# Patient Record
Sex: Female | Born: 1970 | Race: White | Hispanic: No | Marital: Married | Smoking: Never smoker
Health system: Southern US, Community
[De-identification: ages and names within clinical notes are randomized; demographics above are authoritative.]

## PROBLEM LIST (undated history)

## (undated) HISTORY — PX: ABDOMINAL HYSTERECTOMY: SHX81

---

## 2005-10-22 ENCOUNTER — Ambulatory Visit: Payer: Self-pay | Admitting: Obstetrics and Gynecology

## 2006-07-08 ENCOUNTER — Encounter: Payer: Self-pay | Admitting: Maternal & Fetal Medicine

## 2007-01-10 ENCOUNTER — Ambulatory Visit: Payer: Self-pay | Admitting: Obstetrics and Gynecology

## 2007-01-11 ENCOUNTER — Inpatient Hospital Stay: Payer: Self-pay | Admitting: Obstetrics and Gynecology

## 2010-10-24 ENCOUNTER — Ambulatory Visit: Payer: Self-pay | Admitting: Obstetrics and Gynecology

## 2012-03-12 ENCOUNTER — Ambulatory Visit: Payer: Self-pay | Admitting: Obstetrics and Gynecology

## 2012-04-10 ENCOUNTER — Ambulatory Visit: Payer: Self-pay | Admitting: Obstetrics and Gynecology

## 2012-04-10 LAB — URINALYSIS, COMPLETE
Bacteria: NONE SEEN
Bilirubin,UR: NEGATIVE
Glucose,UR: NEGATIVE mg/dL (ref 0–75)
Ketone: NEGATIVE
RBC,UR: <1 /HPF (ref 0–5)
Specific Gravity: 1.003 (ref 1.003–1.030)
Squamous Epithelial: 3
WBC UR: NONE SEEN /HPF (ref 0–5)

## 2012-04-10 LAB — HEMOGLOBIN: HGB: 12.3 g/dL (ref 12.0–16.0)

## 2012-04-18 ENCOUNTER — Ambulatory Visit: Payer: Self-pay | Admitting: Obstetrics and Gynecology

## 2012-04-19 LAB — HEMATOCRIT: HCT: 28.1 % — ABNORMAL LOW (ref 35.0–47.0)

## 2014-02-16 ENCOUNTER — Ambulatory Visit: Payer: Self-pay | Admitting: Obstetrics and Gynecology

## 2014-09-14 NOTE — Op Note (Signed)
PATIENT NAME:  Terri Bowman, Terri Bowman MR#:  [id number] DATE OF BIRTH:  Feb 12, 1971  DATE OF PROCEDURE:  04/18/2012  PREOPERATIVE DIAGNOSIS: Dysfunctional uterine bleeding status post ablation.   POSTOPERATIVE DIAGNOSES: 1. Dysfunctional uterine bleeding status post ablation.  2. Extensive adhesive disease.   PROCEDURES:  1. Laparoscopic supracervical hysterectomy.  2. Extensive adhesiolysis.   SURGEON: Veatrice Bourbonicky Eaton Folmar, MD   ASSISTANT: Scrub tech    ANESTHESIA: General.   DRAINS: Foley.   ANTIBIOTICS: 1 gram IV.   FINDINGS: Uterus with adherent thick adhesions to the right anterior abdominal wall, omental adhesions throughout the lower abdomen, dense bladder flap adhesions and scarring.   ESTIMATED BLOOD LOSS: 50.   COMPLICATIONS: None.   SPECIMENS: Uterus morcellated.   PROCEDURE IN DETAIL: The patient was consented. Preoperative antibiotics were given. She was taken to the operating room and placed in the supine position, prepped and draped in the usual sterile fashion after being placed in dorsal lithotomy position using Allen stirrups. Cervix was visualized, grasped with a single-tooth tenaculum. I attempted to place sound through the cervix but was unable to due to ablation of cabal from previous procedure. Umbilicus was visualized. The patient had a very poorly defined flaccid umbilicus, somewhat gaping. I elected to proceed with transverse. Port placed, an 11 port was placed, pneumoperitoneum was allowed to be established with findings as noted above. I carefully then placed 11 ports in bilateral lower quadrants and then over approximately an hour alternating dissection from left and right ports and moving camera from the umbilicus to the left lower quadrant port. Was able to reduce the thick uterine adhesion there, omental adhesions, and confidently dissect the bladder away from the lower uterus.   Then using Enseal endoscopic tissue was cauterized and divided again with the round  ligaments bilaterally and then dissected out the uretero ovarian tubal complex, dissected free from the uterus. Parametrium was developed with the broad ligament. The Enseal device was then used to cauterize and divide the uterine arteries bilaterally. At this point we switched to Harmonic scalpel and was able to create the cervical stump. Again, unable to identify the cervical canal from the abdominal approach. Stump was cauterized extensively. There was persistent oozing from the area of the left uterine artery which was controlled with Harmonic scalpel and light Kleppinger. The ureters were visualized and appeared to be well away from any energy source. Pressure was lowered. The pelvis was copiously irrigated. There was persistent oozing just adjacent to the anterior cervix which was controlled with Kleppingers and  harmonic scalpel. Areas were observed for several minutes under low pressure and procedure was felt to be achieve maximum efficacy. Gross fluid was evacuated. Ports were removed. Incisions were closed with deep of 0, sub-Q with 3-0 Vicryl.   It was noted the umbilical incision extended somewhat to the left where the port was placed. This was superficial dermal insult which extended approximately 4 mm. The patient tolerated the procedure well, returned to the supine position, and left to the care of anesthesia. I anticipate a routine postoperative course.  ____________________________ Reatha Harpsicky L. Logan BoresEvans, MD rle:drc D: 04/18/2012 10:26:36 ET T: 04/18/2012 10:56:01 ET JOB#: [id number]  cc: Ricky L. Logan BoresEvans, MD, <Dictator> Augustina MoodICK L Liliann File MD ELECTRONICALLY SIGNED 04/19/2012 10:24

## 2016-08-03 ENCOUNTER — Encounter: Payer: Self-pay | Admitting: Medical

## 2016-08-03 ENCOUNTER — Ambulatory Visit: Payer: Self-pay | Admitting: Medical

## 2016-08-03 VITALS — BP 120/78 | HR 69 | Temp 98.7°F | Resp 16 | Ht 60.0 in | Wt 125.0 lb

## 2016-08-03 DIAGNOSIS — L089 Local infection of the skin and subcutaneous tissue, unspecified: Secondary | ICD-10-CM

## 2016-08-03 MED ORDER — CEPHALEXIN 500 MG PO CAPS: 500.0000 mg | ORAL_CAPSULE | Freq: Four times a day (QID) | ORAL | 0 refills | Status: DC

## 2016-08-03 NOTE — Progress Notes (Signed)
   Subjective:    Patient ID: Terri BoxLesley M Stang, female    DOB: 04/08/1971, 46 y.o.   MRN: [id number]  HPI 46 yo female who  Noticed right pinkie finger with swelling and pain yesterday morning. Does recall hitting it or being bitten by something. 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 Has taken nothing for pain.    Review of Systems  Constitutional: Negative for chills and fever.  Respiratory: Negative for shortness of breath.   Cardiovascular: Negative for chest pain.  Neurological: Negative for weakness and numbness.   or tingling.     Objective:   Physical Exam  Constitutional: She is oriented to person, place, and time. She appears well-developed.  HENT:  Head: Normocephalic and atraumatic.  Pulmonary/Chest: Effort normal.  Musculoskeletal: Normal range of motion. She exhibits edema and tenderness. She exhibits no deformity.  Neurological: She is alert and oriented to person, place, and time.  5th right finger with swelling, tenderness to palpation.  Erythema noted distally. Patient with limited range of motion due to swelling. <2sCR patient with acrylic nails, skin intact. Rest of right hand and left hand wnl.        Assessment & Plan:  Ice and elevate the finger. Take keflex as prescribed. Return Monday for recheck. otc motrin take as directed for pain and swelling. Seek out medical care over the weekend if worsening , increased pain or fever. RN Jcummings placed splint on patients finger.

## 2016-08-03 NOTE — Patient Instructions (Signed)
Wear splint , remove for showering. Take antibiotics as prescribed. Ice and elevate. otc motrin  600mg  (3 tablets) every 6 hours with food for pain and swelling. Return Monday for recheck. Seek medical care if worse, increased pain or fever.

## 2016-08-06 ENCOUNTER — Ambulatory Visit: Payer: Self-pay | Admitting: Medical

## 2016-08-06 VITALS — BP 118/78 | HR 64 | Temp 97.7°F | Resp 16 | Ht 60.0 in | Wt 130.0 lb

## 2016-08-06 DIAGNOSIS — L089 Local infection of the skin and subcutaneous tissue, unspecified: Secondary | ICD-10-CM

## 2016-08-06 NOTE — Progress Notes (Signed)
   Subjective:    Patient ID: Terri Bowman, female    DOB: Jul 15, 1970, 46 y.o.   MRN: [id number]  HPI 46yo female returns to clinic for recheck on skin infection located on right pinkie finger. Started on Keflex on 08/03/16(inital exam). Patient states it is "much better".    Review of Systems  No fever or chills, able to move finger better. Decreased redness of finger.     Objective:   Physical Exam  Constitutional: She appears well-developed and well-nourished.  HENT:  Head: Normocephalic and atraumatic.  Musculoskeletal: Normal range of motion.  of 5th right finger, erythema much improved, swelling still present but improved.        Assessment & Plan:  Skin infection of right 5th finger improving. Continue and finish Keflex antibiotics. Try to elevate as much as possible to improve swelling. Return to the clinic as needed.

## 2016-12-31 ENCOUNTER — Other Ambulatory Visit: Payer: Self-pay | Admitting: Obstetrics & Gynecology

## 2016-12-31 DIAGNOSIS — Z1231 Encounter for screening mammogram for malignant neoplasm of breast: Secondary | ICD-10-CM

## 2017-01-16 ENCOUNTER — Ambulatory Visit
Admission: RE | Admit: 2017-01-16 | Discharge: 2017-01-16 | Disposition: A | Discharge status: Home / Self Care | Payer: BLUE CROSS/BLUE SHIELD | Source: Ambulatory Visit | Attending: Obstetrics & Gynecology | Admitting: Obstetrics & Gynecology

## 2017-01-16 DIAGNOSIS — Z1231 Encounter for screening mammogram for malignant neoplasm of breast: Secondary | ICD-10-CM | POA: Diagnosis not present

## 2018-02-03 ENCOUNTER — Ambulatory Visit: Payer: Self-pay | Admitting: Medical

## 2018-02-03 ENCOUNTER — Encounter: Payer: Self-pay | Admitting: Medical

## 2018-02-03 VITALS — BP 153/88 | HR 70 | Temp 98.0°F | Resp 16 | Wt 133.4 lb

## 2018-02-03 DIAGNOSIS — H6123 Impacted cerumen, bilateral: Secondary | ICD-10-CM

## 2018-02-03 DIAGNOSIS — R0982 Postnasal drip: Secondary | ICD-10-CM

## 2018-02-03 DIAGNOSIS — J069 Acute upper respiratory infection, unspecified: Secondary | ICD-10-CM

## 2018-02-03 MED ORDER — AZITHROMYCIN 250 MG PO TABS
ORAL_TABLET | ORAL | 0 refills | Status: DC
Start: 2018-02-03 — End: 2018-07-08

## 2018-02-03 NOTE — Progress Notes (Signed)
   Subjective:    Patient ID: Terri Bowman, female    DOB: 06/30/1970, 47 y.o.   MRN: [id number]  HPI 47 yo female in non acute distress.  Started with scratchy throat on Thursday and not feeling well. , Slept a lot and rested over weekend..  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  Thinks it might be a cold but not sure and would like to be checked. Nasal congestion, clear discharge, cough colored sputum yellow. No fever or chills, some  nausea this morning lasting minutes.  No chest pain, mild shortness of breath and chest tightness..Using OTC generic cough medication.   Non smoker. Blood pressure (!) 153/88, pulse 70, temperature 98 F (36.7 C), temperature source Oral, resp. rate 16, weight 133 lb 6.4 oz (60.5 kg), SpO2 99 %.  Review of Systems  Constitutional: Negative for chills and fever.  HENT: Positive for postnasal drip, rhinorrhea, sinus pressure (maxillary), sinus pain, sneezing, sore throat (scratchy), trouble swallowing and voice change. Negative for tinnitus.        Stoppped up ears bilateral  Eyes: Negative for discharge, itching and visual disturbance.  Respiratory: Positive for cough, chest tightness and shortness of breath.   Cardiovascular: Negative for chest pain, palpitations and leg swelling.  Gastrointestinal: Positive for nausea (this am lasted minutes). Negative for abdominal pain.  Endocrine: Negative for polydipsia, polyphagia and polyuria.  Genitourinary: Negative for dysuria.  Musculoskeletal: Negative for myalgias.  Skin: Negative for rash.  Allergic/Immunologic: Positive for environmental allergies. Negative for food allergies.  Neurological: Positive for headaches (little bit). Negative for dizziness, syncope and light-headedness.  Hematological: Negative for adenopathy.  Psychiatric/Behavioral: Negative for behavioral problems, confusion, self-injury and suicidal ideas.       Objective:   Physical Exam  Constitutional: She is oriented to person, place, and  time. She appears well-developed and well-nourished.  HENT:  Head: Normocephalic and atraumatic.  Right Ear: External ear normal.  Left Ear: External ear normal.  Eyes: Pupils are equal, round, and reactive to light. Conjunctivae and EOM are normal.  Neck: Normal range of motion. Neck supple.  Cardiovascular: Normal rate, regular rhythm and normal heart sounds.  Pulmonary/Chest: Effort normal and breath sounds normal.  Lymphadenopathy:    She has no cervical adenopathy.  Neurological: She is alert and oriented to person, place, and time.  Skin: Skin is warm and dry.  Psychiatric: She has a normal mood and affect. Her behavior is normal. Judgment and thought content normal.    Post nasl drip coughand clearing of thraot noted.      Assessment & Plan:  URI, PND Cerumen impaction Debrox otic drops as directed. Meds ordered this encounter  Medications  . azithromycin (ZITHROMAX) 250 MG tablet    Sig: Take two tablets today then one tablet days 2-4.    Dispense:  6 tablet    Refill:  0  Take OTC Zyrtec or Claritin and Mucinex take as directed. Stope cough medication it may be raising blood pressure . Return in  3-5 days if not improving. Patient verbalizes understanding and has no questions at discharge.

## 2018-02-03 NOTE — Patient Instructions (Signed)
Earwax Buildup, Adult The ears produce a substance called earwax that helps keep bacteria out of the ear and protects the skin in the ear canal. Occasionally, earwax can build up in the ear and cause discomfort or hearing loss. What increases the risk? This condition is more likely to develop in people who:  Are female.  Are elderly.  Naturally produce more earwax.  Clean their ears often with cotton swabs.  Use earplugs often.  Use in-ear headphones often.  Wear hearing aids.  Have narrow ear canals.  Have earwax that is overly thick or sticky.  Have eczema.  Are dehydrated.  Have excess hair in the ear canal.  What are the signs or symptoms? Symptoms of this condition include:  Reduced or muffled hearing.  A feeling of fullness in the ear or feeling that the ear is plugged.  Fluid coming from the ear.  Ear pain.  Ear itch.  Ringing in the ear.  Coughing.  An obvious piece of earwax that can be seen inside the ear canal.  How is this diagnosed? This condition may be diagnosed based on:  Your symptoms.  Your medical history.  An ear exam. During the exam, your health care provider will look into your ear with an instrument called an otoscope.  You may have tests, including a hearing test. How is this treated? This condition may be treated by:  Using ear drops to soften the earwax.  Having the earwax removed by a health care provider. The health care provider may: ? Flush the ear with water. ? Use an instrument that has a loop on the end (curette). ? Use a suction device.  Surgery to remove the wax buildup. This may be done in severe cases.  Follow these instructions at home:  Take over-the-counter and prescription medicines only as told by your health care provider.  Do not put any objects, including cotton swabs, into your ear. You can clean the opening of your ear canal with a washcloth or facial tissue.  Follow instructions from your health  care provider about cleaning your ears. Do not over-clean your ears.  Drink enough fluid to keep your urine clear or pale yellow. This will help to thin the earwax.  Keep all follow-up visits as told by your health care provider. If earwax builds up in your ears often or if you use hearing aids, consider seeing your health care provider for routine, preventive ear cleanings. Ask your health care provider how often you should schedule your cleanings.  If you have hearing aids, clean them according to instructions from the manufacturer and your health care provider. Contact a health care provider if:  You have ear pain.  You develop a fever.  You have blood, pus, or other fluid coming from your ear.  You have hearing loss.  You have ringing in your ears that does not go away.  Your symptoms do not improve with treatment.  You feel like the room is spinning (vertigo). Summary  Earwax can build up in the ear and cause discomfort or hearing loss.  The most common symptoms of this condition include reduced or muffled hearing and a feeling of fullness in the ear or feeling that the ear is plugged.  This condition may be diagnosed based on your symptoms, your medical history, and an ear exam.  This condition may be treated by using ear drops to soften the earwax or by having the earwax removed by a health care provider.  Do   not put any objects, including cotton swabs, into your ear. You can clean the opening of your ear canal with a washcloth or facial tissue. This information is not intended to replace advice given to you by your health care provider. Make sure you discuss any questions you have with your health care provider. Document Released: 06/21/2004 Document Revised: 07/25/2016 Document Reviewed: 07/25/2016 Elsevier Interactive Patient Education  2018 [employer] Upper Respiratory Infection, Adult Most upper respiratory infections (URIs) are caused by a virus. A URI affects  the nose, throat, and upper air passages. The most common type of URI is often called "the common cold." Follow these instructions at home:  Take medicines only as told by your doctor.  Gargle warm saltwater or take cough drops to comfort your throat as told by your doctor.  Use a warm mist humidifier or inhale steam from a shower to increase air moisture. This may make it easier to breathe.  Drink enough fluid to keep your pee (urine) clear or pale yellow.  Eat soups and other clear broths.  Have a healthy diet.  Rest as needed.  Go back to work when your fever is gone or your doctor says it is okay. ? You may need to stay home longer to avoid giving your URI to others. ? You can also wear a face mask and wash your hands often to prevent spread of the virus.  Use your inhaler more if you have asthma.  Do not use any tobacco products, including cigarettes, chewing tobacco, or electronic cigarettes. If you need help quitting, ask your doctor. Contact a doctor if:  You are getting worse, not better.  Your symptoms are not helped by medicine.  You have chills.  You are getting more short of breath.  You have brown or red mucus.  You have yellow or brown discharge from your nose.  You have pain in your face, especially when you bend forward.  You have a fever.  You have puffy (swollen) neck glands.  You have pain while swallowing.  You have white areas in the back of your throat. Get help right away if:  You have very bad or constant: ? Headache. ? Ear pain. ? Pain in your forehead, behind your eyes, and over your cheekbones (sinus pain). ? Chest pain.  You have long-lasting (chronic) lung disease and any of the following: ? Wheezing. ? Long-lasting cough. ? Coughing up blood. ? A change in your usual mucus.  You have a stiff neck.  You have changes in your: ? Vision. ? Hearing. ? Thinking. ? Mood. This information is not intended to replace advice  given to you by your health care provider. Make sure you discuss any questions you have with your health care provider. Document Released: 10/31/2007 Document Revised: 01/15/2016 Document Reviewed: 08/19/2013 Elsevier Interactive Patient Education  2018 [employer].

## 2018-02-18 ENCOUNTER — Other Ambulatory Visit: Payer: Self-pay | Admitting: Obstetrics & Gynecology

## 2018-02-18 DIAGNOSIS — Z1231 Encounter for screening mammogram for malignant neoplasm of breast: Secondary | ICD-10-CM

## 2018-03-05 ENCOUNTER — Ambulatory Visit
Admission: RE | Admit: 2018-03-05 | Discharge: 2018-03-05 | Disposition: A | Discharge status: Home / Self Care | Payer: BLUE CROSS/BLUE SHIELD | Source: Ambulatory Visit | Attending: Obstetrics & Gynecology | Admitting: Obstetrics & Gynecology

## 2018-03-05 DIAGNOSIS — Z1231 Encounter for screening mammogram for malignant neoplasm of breast: Secondary | ICD-10-CM

## 2018-07-08 ENCOUNTER — Ambulatory Visit: Payer: Self-pay | Admitting: Medical

## 2018-07-08 ENCOUNTER — Encounter: Payer: Self-pay | Admitting: Medical

## 2018-07-08 VITALS — BP 132/95 | HR 69 | Temp 98.5°F | Resp 16 | Wt 133.6 lb

## 2018-07-08 DIAGNOSIS — J029 Acute pharyngitis, unspecified: Secondary | ICD-10-CM

## 2018-07-08 MED ORDER — AMOXICILLIN-POT CLAVULANATE 875-125 MG PO TABS: 1.0000 | ORAL_TABLET | Freq: Two times a day (BID) | ORAL | 0 refills | Status: AC

## 2018-07-08 NOTE — Progress Notes (Signed)
   Subjective:    Patient ID: Terri Bowman, female    DOB: 31-Aug-1970, 48 y.o.   MRN: [id number]  HPI 48 yo female in non acute distress , stared with sore throat and congestion yesterday. Daughter  4 yo diagnosed with strep throat today.Has slept in bed with daughter the last  2 days. Denies fever or chills, cough or shortness of breath. A little nausea this morning.Denies chest pain or rash.   Blood pressure (!) 132/95, pulse 69, temperature 98.5 F (36.9 C), temperature source Tympanic, resp. rate 16, weight 133 lb 9.6 oz (60.6 kg), SpO2 100 %. No Known Allergies  Review of Systems  Constitutional: Negative for chills and fever.  HENT: Positive for postnasal drip.   Eyes: Negative for discharge and itching.  Respiratory: Positive for cough (mild). Negative for shortness of breath and wheezing.   Cardiovascular: Negative for chest pain.  Gastrointestinal: Positive for nausea (a little bit). Negative for diarrhea and vomiting.  Musculoskeletal: Positive for myalgias (little bit).  Skin: Negative for rash.      non smoker Objective:   Physical Exam Vitals signs and nursing note reviewed.  Constitutional:      Appearance: She is well-developed.  HENT:     Head: Normocephalic and atraumatic.     Jaw: There is normal jaw occlusion.     Right Ear: Ear canal normal. There is impacted cerumen.     Left Ear: Ear canal normal. There is impacted cerumen.     Nose: Congestion present.     Mouth/Throat:     Mouth: Mucous membranes are moist. No oral lesions.     Pharynx: Oropharynx is clear. Posterior oropharyngeal erythema present. No pharyngeal swelling, oropharyngeal exudate or uvula swelling.     Tonsils: No tonsillar exudate. Swelling: 2+ on the right. 2+ on the left.  Eyes:     Conjunctiva/sclera: Conjunctivae normal.     Pupils: Pupils are equal, round, and reactive to light.  Neck:     Musculoskeletal: Normal range of motion and neck supple.  Cardiovascular:     Rate and  Rhythm: Normal rate and regular rhythm.     Heart sounds: Normal heart sounds.  Pulmonary:     Effort: Pulmonary effort is normal.     Breath sounds: Normal breath sounds.  Lymphadenopathy:     Cervical: No cervical adenopathy.  Skin:    General: Skin is warm and dry.     Capillary Refill: Capillary refill takes less than 2 seconds.  Neurological:     General: No focal deficit present.     Mental Status: She is alert and oriented to person, place, and time.  Psychiatric:        Mood and Affect: Mood normal.        Behavior: Behavior normal.       No cough in room    Assessment & Plan:  Phayngitis Meds ordered this encounter  Medications  . amoxicillin-clavulanate (AUGMENTIN) 875-125 MG tablet    Sig: Take 1 tablet by mouth 2 (two) times daily.    Dispense:  20 tablet    Refill:  0  Dilute salt water gargles,  Soft foods , avoid acidic foods. Return in 3-5 days if not improving. Patient verbalizes understanding and has no questions at discharge.

## 2018-07-09 ENCOUNTER — Ambulatory Visit: Payer: Self-pay | Admitting: Medical

## 2019-04-28 ENCOUNTER — Other Ambulatory Visit: Payer: Self-pay

## 2019-04-28 DIAGNOSIS — Z20822 Contact with and (suspected) exposure to covid-19: Secondary | ICD-10-CM

## 2019-04-30 LAB — NOVEL CORONAVIRUS, NAA: SARS-CoV-2, NAA: NOT DETECTED

## 2019-05-13 ENCOUNTER — Other Ambulatory Visit: Payer: Self-pay | Admitting: Obstetrics & Gynecology

## 2019-05-13 DIAGNOSIS — Z1231 Encounter for screening mammogram for malignant neoplasm of breast: Secondary | ICD-10-CM

## 2019-06-05 ENCOUNTER — Ambulatory Visit
Admission: RE | Admit: 2019-06-05 | Discharge: 2019-06-05 | Disposition: A | Discharge status: Home / Self Care | Payer: BC Managed Care – PPO | Source: Ambulatory Visit | Attending: Obstetrics & Gynecology | Admitting: Obstetrics & Gynecology

## 2019-06-05 DIAGNOSIS — Z1231 Encounter for screening mammogram for malignant neoplasm of breast: Secondary | ICD-10-CM | POA: Insufficient documentation

## 2020-05-17 ENCOUNTER — Other Ambulatory Visit: Payer: Self-pay | Admitting: Obstetrics & Gynecology

## 2020-05-17 DIAGNOSIS — Z1231 Encounter for screening mammogram for malignant neoplasm of breast: Secondary | ICD-10-CM

## 2020-06-06 ENCOUNTER — Other Ambulatory Visit: Payer: Self-pay

## 2020-06-06 ENCOUNTER — Ambulatory Visit
Admission: RE | Admit: 2020-06-06 | Discharge: 2020-06-06 | Disposition: A | Discharge status: Home / Self Care | Payer: BC Managed Care – PPO | Source: Ambulatory Visit | Attending: Obstetrics & Gynecology | Admitting: Obstetrics & Gynecology

## 2020-06-06 DIAGNOSIS — Z1231 Encounter for screening mammogram for malignant neoplasm of breast: Secondary | ICD-10-CM | POA: Insufficient documentation

## 2021-08-08 ENCOUNTER — Other Ambulatory Visit: Payer: Self-pay | Admitting: Certified Nurse Midwife

## 2021-08-08 DIAGNOSIS — Z1231 Encounter for screening mammogram for malignant neoplasm of breast: Secondary | ICD-10-CM

## 2021-09-13 ENCOUNTER — Ambulatory Visit
Admission: RE | Admit: 2021-09-13 | Discharge: 2021-09-13 | Disposition: A | Discharge status: Home / Self Care | Payer: BC Managed Care – PPO | Source: Ambulatory Visit | Attending: Certified Nurse Midwife | Admitting: Certified Nurse Midwife

## 2021-09-13 DIAGNOSIS — Z1231 Encounter for screening mammogram for malignant neoplasm of breast: Secondary | ICD-10-CM | POA: Insufficient documentation

## 2022-09-19 ENCOUNTER — Other Ambulatory Visit: Payer: Self-pay | Admitting: Family Medicine

## 2022-09-19 DIAGNOSIS — Z1231 Encounter for screening mammogram for malignant neoplasm of breast: Secondary | ICD-10-CM

## 2022-09-21 ENCOUNTER — Ambulatory Visit
Admission: RE | Admit: 2022-09-21 | Discharge: 2022-09-21 | Disposition: A | Discharge status: Home / Self Care | Payer: BC Managed Care – PPO | Source: Ambulatory Visit | Attending: Family Medicine | Admitting: Family Medicine

## 2022-09-21 DIAGNOSIS — Z1231 Encounter for screening mammogram for malignant neoplasm of breast: Secondary | ICD-10-CM | POA: Insufficient documentation

## 2022-09-26 ENCOUNTER — Other Ambulatory Visit: Payer: Self-pay | Admitting: Family Medicine

## 2022-09-26 DIAGNOSIS — N63 Unspecified lump in unspecified breast: Secondary | ICD-10-CM

## 2022-09-26 DIAGNOSIS — R928 Other abnormal and inconclusive findings on diagnostic imaging of breast: Secondary | ICD-10-CM

## 2022-10-03 ENCOUNTER — Ambulatory Visit
Admission: RE | Admit: 2022-10-03 | Discharge: 2022-10-03 | Disposition: A | Discharge status: Home / Self Care | Payer: BC Managed Care – PPO | Source: Ambulatory Visit | Attending: Family Medicine | Admitting: Family Medicine

## 2022-10-03 DIAGNOSIS — N63 Unspecified lump in unspecified breast: Secondary | ICD-10-CM

## 2022-10-03 DIAGNOSIS — R928 Other abnormal and inconclusive findings on diagnostic imaging of breast: Secondary | ICD-10-CM | POA: Insufficient documentation

## 2022-11-16 IMAGING — MG MM DIGITAL SCREENING BILAT W/ TOMO AND CAD
8 series · 9 of 24 positions shown · non-contrast
Comparison: Previous exam(s).

CLINICAL DATA: Screening.

EXAM:
DIGITAL SCREENING BILATERAL MAMMOGRAM WITH TOMOSYNTHESIS AND CAD
TECHNIQUE: Bilateral screening digital craniocaudal and mediolateral oblique
mammograms were obtained. Bilateral screening digital breast
tomosynthesis was performed. The images were evaluated with
computer-aided detection.

[L CC synth-2D]
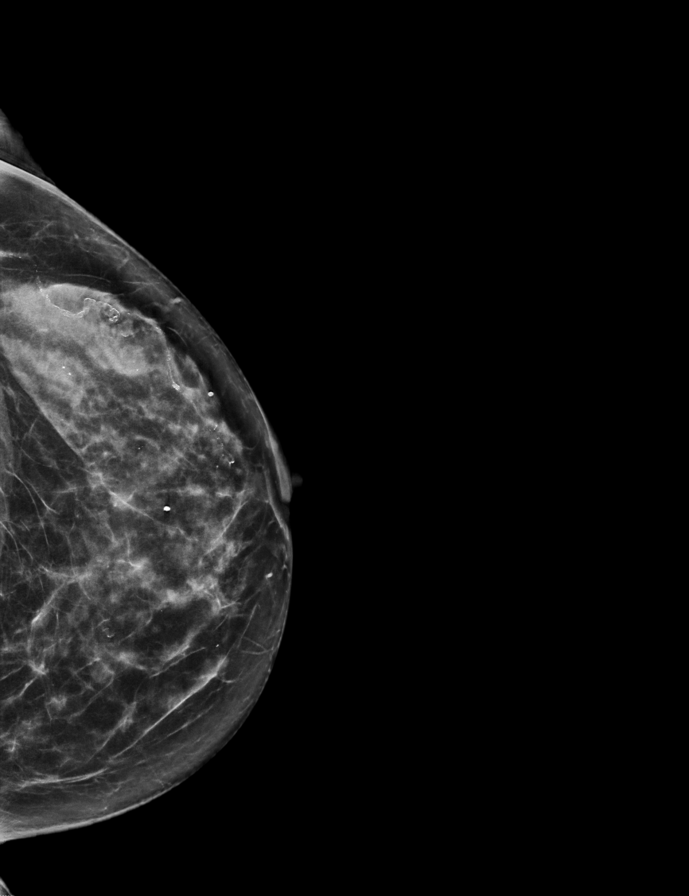

[R MLO synth-2D]
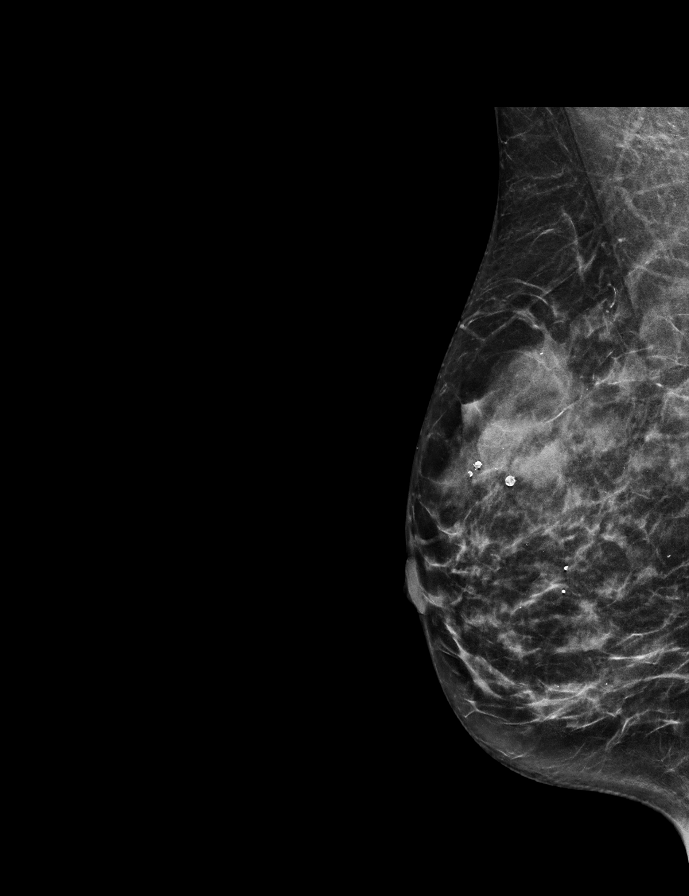

[L MLO synth-2D]
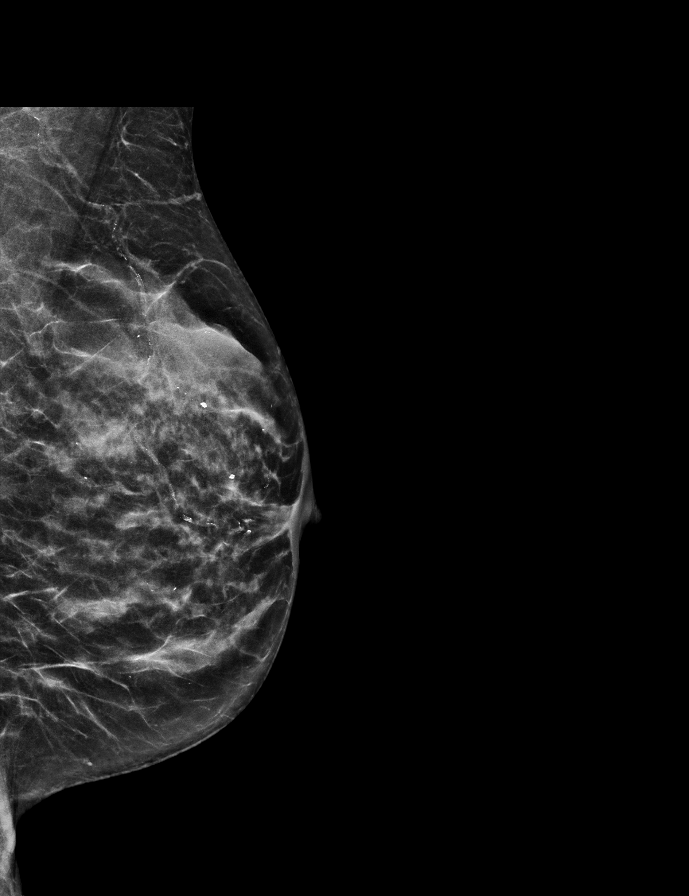

[R CC synth-2D]
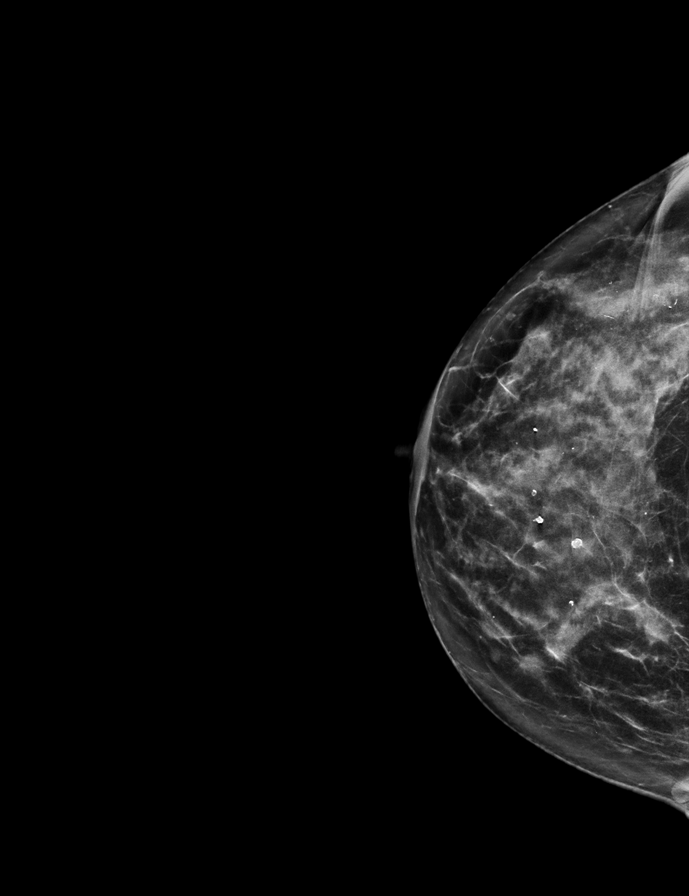

[L MLO tomo · 2 of 54 frames shown]
[frame 18/54]
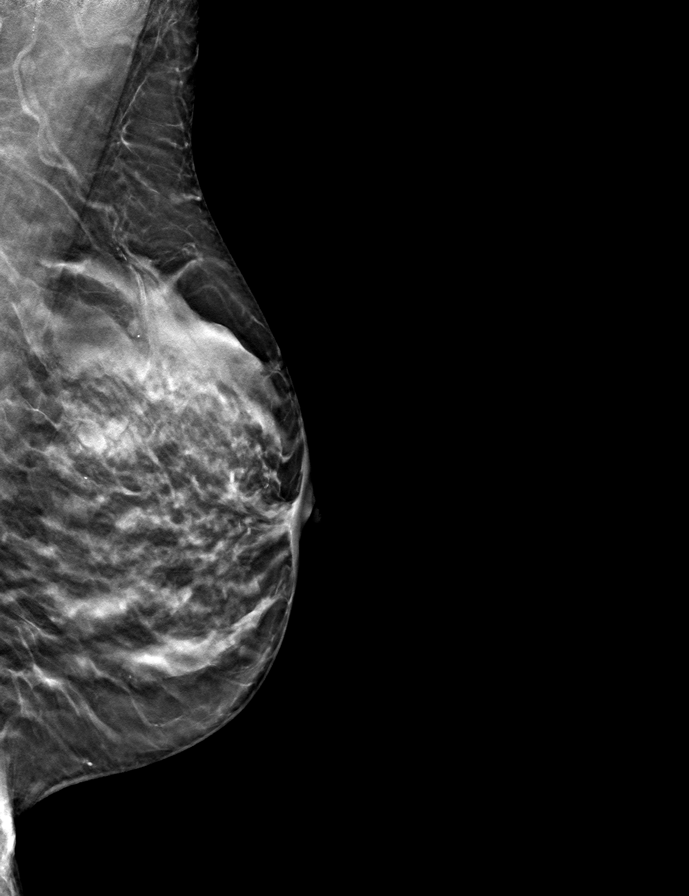
[frame 27/54]
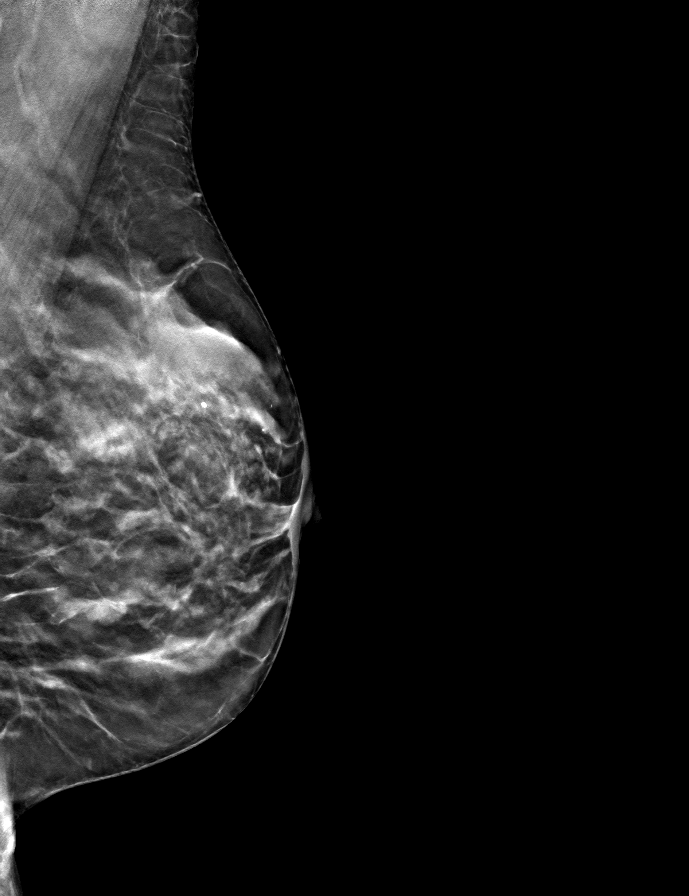

[R CC tomo · tomo slice 30/59.0]
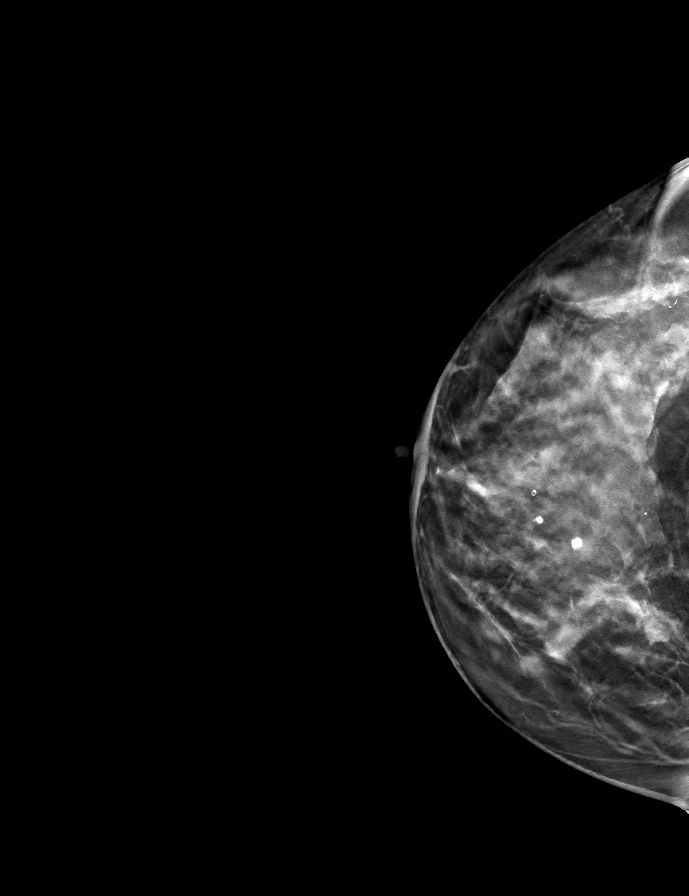

[R MLO tomo · tomo slice 28/55.0]
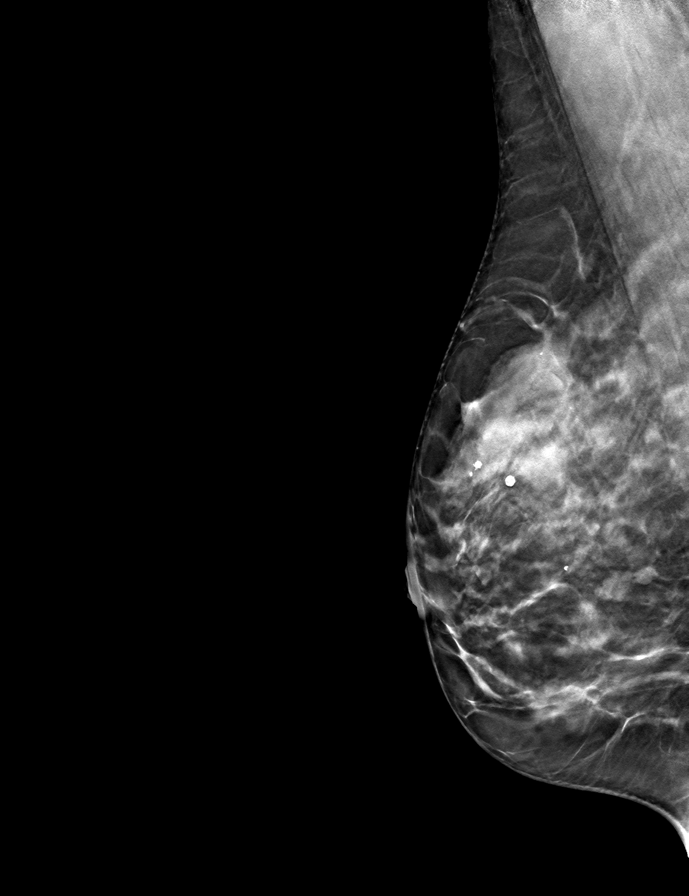

[L CC tomo · tomo slice 30/59.0]
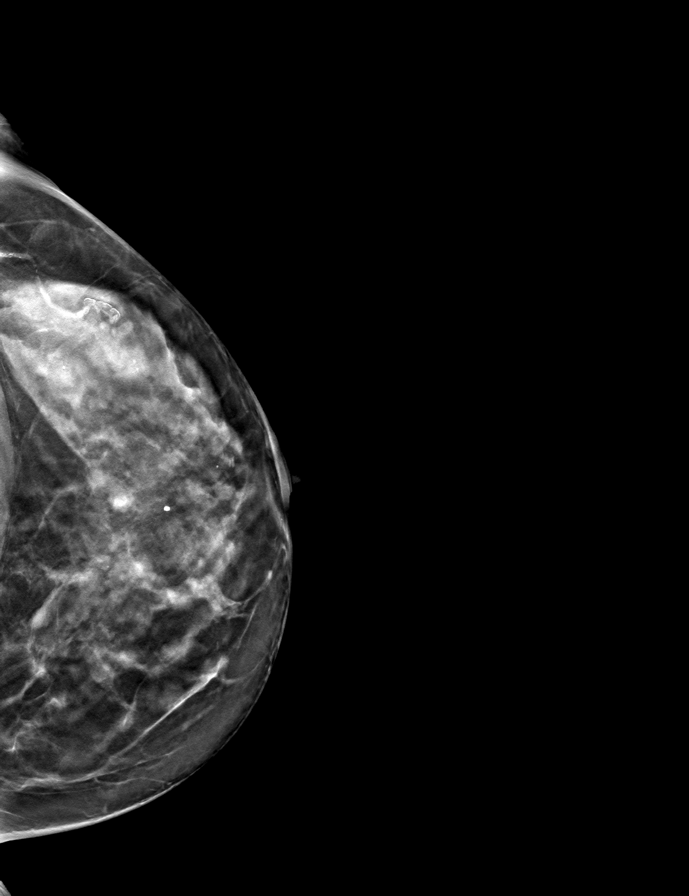

[9 of 24 positions shown; findings below may reference images not displayed]

ACR Breast Density Category d: The breast tissue is extremely dense,
which lowers the sensitivity of mammography
FINDINGS: There are no findings suspicious for malignancy.
IMPRESSION: No mammographic evidence of malignancy. A result letter of this
screening mammogram will be mailed directly to the patient.

RECOMMENDATION:
Screening mammogram in one year. (Code:TA-V-WV9)

BI-RADS CATEGORY  1: Negative.

## 2023-10-30 ENCOUNTER — Other Ambulatory Visit: Payer: Self-pay | Admitting: Family Medicine

## 2023-10-30 DIAGNOSIS — Z1231 Encounter for screening mammogram for malignant neoplasm of breast: Secondary | ICD-10-CM

## 2023-11-13 ENCOUNTER — Ambulatory Visit
Admission: RE | Admit: 2023-11-13 | Discharge: 2023-11-13 | Disposition: A | Discharge status: Home / Self Care | Source: Ambulatory Visit | Attending: Family Medicine | Admitting: Family Medicine

## 2023-11-13 DIAGNOSIS — Z1231 Encounter for screening mammogram for malignant neoplasm of breast: Secondary | ICD-10-CM | POA: Diagnosis present
